# Patient Record
Sex: Male | Born: 1965 | Race: White | Hispanic: No | Marital: Married | Smoking: Never smoker
Health system: Southern US, Community
[De-identification: ages and names within clinical notes are randomized; demographics above are authoritative.]

## PROBLEM LIST (undated history)

## (undated) DIAGNOSIS — E785 Hyperlipidemia, unspecified: Secondary | ICD-10-CM

## (undated) DIAGNOSIS — I219 Acute myocardial infarction, unspecified: Secondary | ICD-10-CM

## (undated) DIAGNOSIS — I10 Essential (primary) hypertension: Secondary | ICD-10-CM

## (undated) HISTORY — PX: CORONARY ANGIOPLASTY WITH STENT PLACEMENT: SHX49

## (undated) HISTORY — PX: SHOULDER ARTHROSCOPY: SHX128

## (undated) HISTORY — PX: KNEE ARTHROSCOPY: SHX127

---

## 2016-08-17 ENCOUNTER — Emergency Department (INDEPENDENT_AMBULATORY_CARE_PROVIDER_SITE_OTHER)
Admission: EM | Admit: 2016-08-17 | Discharge: 2016-08-17 | Disposition: A | Discharge status: Home / Self Care | Payer: Federal, State, Local not specified - PPO | Source: Home / Self Care | Attending: Family Medicine | Admitting: Family Medicine

## 2016-08-17 ENCOUNTER — Encounter: Payer: Self-pay | Admitting: *Deleted

## 2016-08-17 DIAGNOSIS — B353 Tinea pedis: Secondary | ICD-10-CM | POA: Diagnosis not present

## 2016-08-17 HISTORY — DX: Hyperlipidemia, unspecified: E78.5

## 2016-08-17 HISTORY — DX: Essential (primary) hypertension: I10

## 2016-08-17 MED ORDER — CICLOPIROX OLAMINE 0.77 % EX CREA: TOPICAL_CREAM | Freq: Two times a day (BID) | CUTANEOUS | 1 refills | Status: DC

## 2016-08-17 NOTE — ED Triage Notes (Signed)
Pt c/o bilateral foot itching and burning x 1 week. Used tinactin and Lamisil otc.

## 2016-08-17 NOTE — ED Provider Notes (Signed)
[facility]    CSN: [id number] Arrival date & time: 08/17/16  1055     History   Chief Complaint Chief Complaint  Patient presents with  . Tinea Pedis    HPI Travis Walton is a 50 y.o. male.   Patient complains of two week history of pruritic/burning rash on both feet.   He has had no improvement with Tinactin and Lamisil cream.   The history is provided by the patient.  Rash  Location:  Foot Foot rash location:  Sole of L foot and sole of R foot Quality: burning, dryness, itchiness, redness and scaling   Quality: not blistering, not bruising, not draining, not painful, not peeling, not swelling and not weeping   Severity:  Mild Onset quality:  Gradual Duration:  2 weeks Timing:  Constant Progression:  Worsening Chronicity:  New Context: not animal contact, not chemical exposure, not exposure to similar rash, not hot tub use, not insect bite/sting, not medications, not new detergent/soap and not plant contact   Relieved by:  Nothing Worsened by:  Nothing Ineffective treatments:  Anti-fungal cream Associated symptoms: no induration, no joint pain and no myalgias     Past Medical History:  Diagnosis Date  . Hyperlipidemia   . Hypertension     There are no active problems to display for this patient.   Past Surgical History:  Procedure Laterality Date  . KNEE ARTHROSCOPY Left   . SHOULDER ARTHROSCOPY Left        Home Medications    Prior to Admission medications   Medication Sig Start Date End Date Taking? Authorizing Provider  atorvastatin (LIPITOR) 40 MG tablet Take 40 mg by mouth daily.   Yes Historical Provider, MD  Levothyroxine Sodium (SYNTHROID PO) Take by mouth.   Yes Historical Provider, MD  metoprolol (LOPRESSOR) 50 MG tablet Take 50 mg by mouth 2 (two) times daily.   Yes Historical Provider, MD  TESTOSTERONE IM Inject into the muscle.   Yes Historical Provider, MD  topiramate (TOPAMAX) 25 MG capsule Take 25 mg by mouth 2 (two)  times daily.   Yes Historical Provider, MD  zolmitriptan (ZOMIG) 5 MG tablet Take 5 mg by mouth as needed for migraine.   Yes Historical Provider, MD  ciclopirox (LOPROX) 0.77 % cream Apply topically 2 (two) times daily. Use for 2 to 4 weeks until condition clears. 08/17/16   Lattie HawStephen A Ante Arredondo, MD    Family History Family History  Problem Relation Age of Onset  . Heart failure Mother   . Heart failure Father     Social History Social History  Substance Use Topics  . Smoking status: Never Smoker  . Smokeless tobacco: Never Used  . Alcohol use No     Allergies   Hydrocodone   Review of Systems Review of Systems  Musculoskeletal: Negative for arthralgias and myalgias.  Skin: Positive for rash.  All other systems reviewed and are negative.    Physical Exam Triage Vital Signs ED Triage Vitals  Enc Vitals Group     BP 08/17/16 1119 131/83     Pulse Rate 08/17/16 1119 63     Resp 08/17/16 1119 14     Temp --      Temp src --      SpO2 08/17/16 1119 95 %     Weight 08/17/16 1120 294 lb (133.4 kg)     Height --      Head Circumference --      Peak Flow --  Pain Score 08/17/16 1124 5     Pain Loc --      Pain Edu? --      Excl. in GC? --    No data found.   Updated Vital Signs BP 131/83 (BP Location: Left Arm)   Pulse 63   Resp 14   Wt 294 lb (133.4 kg)   SpO2 95%   Visual Acuity Right Eye Distance:   Left Eye Distance:   Bilateral Distance:    Right Eye Near:   Left Eye Near:    Bilateral Near:     Physical Exam  Constitutional: He appears well-developed and well-nourished. No distress.  HENT:  Head: Normocephalic.  Eyes: Pupils are equal, round, and reactive to light.  Cardiovascular: Normal rate.   Pulmonary/Chest: Effort normal.  Musculoskeletal: He exhibits no edema.       Feet:  The web spaces between most toes are erythematous and slightly macerated.  The plantar surfaces over MTP joints are slightly erythematous and scaly.  No swelling  or tenderness to palpation.  Neurological: He is alert.  Skin: Skin is warm and dry.  Nursing note and vitals reviewed.    UC Treatments / Results  Labs (all labs ordered are listed, but only abnormal results are displayed) Labs Reviewed - No data to display  EKG  EKG Interpretation None       Radiology No results found.  Procedures Procedures (including critical care time)  Medications Ordered in UC Medications - No data to display   Initial Impression / Assessment and Plan / UC Course  I have reviewed the triage vital signs and the nursing notes.  Pertinent labs & imaging results that were available during my care of the patient were reviewed by me and considered in my medical decision making (see chart for details).  Clinical Course   Begin Ciclopirox 0.77% cream BID 2 to 4 weeks. May apply 1% hydrocortisone cream once or twice daily to control itching.  If not improved with topical preparation, could switch to oral Lamisil.     Final Clinical Impressions(s) / UC Diagnoses   Final diagnoses:  Tinea pedis of both feet    New Prescriptions New Prescriptions   CICLOPIROX (LOPROX) 0.77 % CREAM    Apply topically 2 (two) times daily. Use for 2 to 4 weeks until condition clears.     Lattie HawStephen A Kitiara Hintze, MD 08/24/16 [phone]

## 2016-08-17 NOTE — Discharge Instructions (Signed)
May apply 1% hydrocortisone cream once or twice daily to control itching.

## 2017-02-16 ENCOUNTER — Encounter: Payer: Self-pay | Admitting: *Deleted

## 2017-02-16 ENCOUNTER — Emergency Department (INDEPENDENT_AMBULATORY_CARE_PROVIDER_SITE_OTHER): Payer: Federal, State, Local not specified - PPO

## 2017-02-16 ENCOUNTER — Emergency Department
Admission: EM | Admit: 2017-02-16 | Discharge: 2017-02-16 | Disposition: A | Discharge status: Home / Self Care | Payer: Federal, State, Local not specified - PPO | Source: Home / Self Care | Attending: Family Medicine | Admitting: Family Medicine

## 2017-02-16 DIAGNOSIS — M7989 Other specified soft tissue disorders: Secondary | ICD-10-CM

## 2017-02-16 DIAGNOSIS — M189 Osteoarthritis of first carpometacarpal joint, unspecified: Secondary | ICD-10-CM | POA: Diagnosis not present

## 2017-02-16 DIAGNOSIS — M79641 Pain in right hand: Secondary | ICD-10-CM

## 2017-02-16 DIAGNOSIS — Z89021 Acquired absence of right finger(s): Secondary | ICD-10-CM | POA: Diagnosis not present

## 2017-02-16 DIAGNOSIS — S60221A Contusion of right hand, initial encounter: Secondary | ICD-10-CM

## 2017-02-16 HISTORY — DX: Acute myocardial infarction, unspecified: I21.9

## 2017-02-16 NOTE — ED Triage Notes (Signed)
Patient reports hitting the back of his right hand yesterday while swinging his arms walking. Pain and swelling developed. No previous injury.

## 2017-02-16 NOTE — ED Provider Notes (Signed)
CSN: [id number]     Arrival date & time 02/16/17  1150 History   First MD Initiated Contact with Patient 02/16/17 1211     Chief Complaint  Patient presents with  . Hand Injury   (Consider location/radiation/quality/duration/timing/severity/associated sxs/prior Treatment) HPI Travis Walton is a 51 y.o. male presenting to UC with c/o Right hand pain and swelling that started yesterday after hitting his hand on an object.  Pt notes he was swinging his arm while walking and hit something but does not recall what. He does not think he broke his hand but is concerned the swelling has not improved since yesterday. 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  He is Right hand dominant.    Past Medical History:  Diagnosis Date  . Heart attack (HCC)   . Hyperlipidemia   . Hypertension    Past Surgical History:  Procedure Laterality Date  . CORONARY ANGIOPLASTY WITH STENT PLACEMENT    . KNEE ARTHROSCOPY Left   . SHOULDER ARTHROSCOPY Left    Family History  Problem Relation Age of Onset  . Heart failure Mother   . Heart failure Father    Social History  Substance Use Topics  . Smoking status: Never Smoker  . Smokeless tobacco: Never Used  . Alcohol use No    Review of Systems  Musculoskeletal: Positive for arthralgias, joint swelling and myalgias.       Right hand  Skin: Negative for color change and wound.  Neurological: Negative for weakness and numbness.    Allergies  Hydrocodone  Home Medications   Prior to Admission medications   Medication Sig Start Date End Date Taking? Authorizing Provider  aspirin EC 81 MG tablet Take 81 mg by mouth daily.   Yes [provider]  atorvastatin (LIPITOR) 80 MG tablet Take 80 mg by mouth daily.   Yes [provider]  busPIRone (BUSPAR) 10 MG tablet Take 10 mg by mouth 3 (three) times daily.   Yes [provider]  Levothyroxine Sodium (SYNTHROID PO) Take  by mouth.   Yes [provider]  metoprolol (LOPRESSOR) 50 MG tablet Take 50 mg by mouth 2 (two) times daily.   Yes [provider]  mirtazapine (REMERON) 15 MG tablet Take 15 mg by mouth at bedtime.   Yes [provider]  nitroGLYCERIN (NITROSTAT) 0.4 MG SL tablet Place 0.4 mg under the tongue every 5 (five) minutes as needed for chest pain.   Yes [provider]  rosuvastatin (CRESTOR) 40 MG tablet Take 40 mg by mouth daily.   Yes [provider]  sertraline (ZOLOFT) 100 MG tablet Take 100 mg by mouth daily.   Yes [provider]  TESTOSTERONE IM Inject into the muscle.   Yes [provider]  ticagrelor (BRILINTA) 90 MG TABS tablet Take by mouth 2 (two) times daily.   Yes [provider]  topiramate (TOPAMAX) 25 MG capsule Take 25 mg by mouth 2 (two) times daily.   Yes [provider]  zolmitriptan (ZOMIG) 5 MG tablet Take 5 mg by mouth as needed for migraine.   Yes [provider]   Meds Ordered and Administered this Visit  Medications - No data to display  BP 131/81 (BP Location: Left Arm)   Pulse 63   Wt 282 lb (127.9 kg)   SpO2 96%  No data found.   Physical Exam  Constitutional: He is oriented to person, place, and  time. He appears well-developed and well-nourished.  HENT:  Head: Normocephalic and atraumatic.  Eyes: EOM are normal.  Neck: Normal range of motion.  Cardiovascular: Normal rate.   Pulses:      Radial pulses are 2+ on the right side.  Pulmonary/Chest: Effort normal.  Musculoskeletal: Normal range of motion. He exhibits edema and tenderness.  Right hand: mild to moderate edema to dorsal aspect over 2nd and 3rd metacarpals. Tender. Full ROM  Neurological: He is alert and oriented to person, place, and time.  Skin: Skin is warm and dry.  Right hand: skin in tact. Faint ecchymosis.   Psychiatric: He has a normal mood and affect. His behavior is normal.  Nursing note and vitals  reviewed.   Urgent Care Course     Procedures (including critical care time)  Labs Review Labs Reviewed - No data to display  Imaging Review Dg Hand Complete Right  Result Date: 02/16/2017 CLINICAL DATA:  Accidental blow to the back of the hand while walking yesterday with onset of pain. Initial encounter. EXAM: RIGHT HAND - COMPLETE 3+ VIEW COMPARISON:  None. FINDINGS: No acute bony or joint abnormality is identified. 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 There is also scattered mild appearing osteoarthritis about the DIP joints. Soft tissues are unremarkable. IMPRESSION: No acute abnormality. 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 Electronically Signed   By: Drusilla Kanner M.D.   On: 02/16/2017 12:42    MDM   1. Contusion of right hand, initial encounter   2. Right hand pain   3. Swelling of right hand    Hand swelling with pain and mild ecchymosis. No erythema or warmth. Doubt infection No evidence of bony injury on imaging.  Ace wrap applied for comfort and help reduce swelling Encouraged use of ice and taking acetaminophen and ibuprofen for pain F/u with PCP or Sports Medicine next week if not improving.    Junius Finner, PA-C 02/16/17 [phone]

## 2017-03-20 ENCOUNTER — Encounter (HOSPITAL_COMMUNITY): Payer: Self-pay

## 2017-03-20 ENCOUNTER — Emergency Department (HOSPITAL_COMMUNITY): Payer: Federal, State, Local not specified - PPO

## 2017-03-20 DIAGNOSIS — Z885 Allergy status to narcotic agent status: Secondary | ICD-10-CM | POA: Insufficient documentation

## 2017-03-20 DIAGNOSIS — I252 Old myocardial infarction: Secondary | ICD-10-CM | POA: Diagnosis not present

## 2017-03-20 DIAGNOSIS — I10 Essential (primary) hypertension: Secondary | ICD-10-CM | POA: Diagnosis not present

## 2017-03-20 DIAGNOSIS — S46212A Strain of muscle, fascia and tendon of other parts of biceps, left arm, initial encounter: Secondary | ICD-10-CM | POA: Insufficient documentation

## 2017-03-20 DIAGNOSIS — Y9389 Activity, other specified: Secondary | ICD-10-CM | POA: Diagnosis not present

## 2017-03-20 DIAGNOSIS — Y929 Unspecified place or not applicable: Secondary | ICD-10-CM | POA: Insufficient documentation

## 2017-03-20 DIAGNOSIS — Y990 Civilian activity done for income or pay: Secondary | ICD-10-CM | POA: Insufficient documentation

## 2017-03-20 DIAGNOSIS — X500XXA Overexertion from strenuous movement or load, initial encounter: Secondary | ICD-10-CM | POA: Insufficient documentation

## 2017-03-20 DIAGNOSIS — Z7982 Long term (current) use of aspirin: Secondary | ICD-10-CM | POA: Insufficient documentation

## 2017-03-20 DIAGNOSIS — Z79899 Other long term (current) drug therapy: Secondary | ICD-10-CM | POA: Diagnosis not present

## 2017-03-20 DIAGNOSIS — M79602 Pain in left arm: Secondary | ICD-10-CM | POA: Diagnosis present

## 2017-03-20 NOTE — ED Triage Notes (Signed)
Pt reports he was lifting a heavy object today while at work and heard something pop in his left anterior upper arm. He reports the pain started at 7pm. Pain worse with movement. Denies CP/SOB. Hx of MI in Dec but states this "is unrelated. This is a muscle pain. Its tender to touch"

## 2017-03-21 ENCOUNTER — Emergency Department (HOSPITAL_COMMUNITY)
Admission: EM | Admit: 2017-03-21 | Discharge: 2017-03-21 | Disposition: A | Discharge status: Home / Self Care | Payer: Federal, State, Local not specified - PPO | Attending: Emergency Medicine | Admitting: Emergency Medicine

## 2017-03-21 DIAGNOSIS — S46212A Strain of muscle, fascia and tendon of other parts of biceps, left arm, initial encounter: Secondary | ICD-10-CM

## 2017-03-21 MED ORDER — TRAMADOL HCL 50 MG PO TABS: 50.0000 mg | ORAL_TABLET | Freq: Four times a day (QID) | ORAL | 0 refills | Status: AC | PRN

## 2017-03-21 NOTE — ED Provider Notes (Signed)
[facility] Provider Note   CSN: [id number] Arrival date & time: 03/20/17  2132  By signing my name below, I, Deland PrettySherilynn Knight, attest that this documentation has been prepared under the direction and in the presence of Taressa Rauh, Canary Brimhristopher J, *. Electronically Signed: Deland PrettySherilynn Knight, ED Scribe. 03/21/17. 3:20 AM.  History   Chief Complaint Chief Complaint  Patient presents with  . Arm Pain    left   The history is provided by the patient and the spouse. No language interpreter was used.   HPI Comments: Travis Walton is a 51 y.o. male who presents to the Emergency Department complaining of sudden onset of moderate upper left arm s/p an injury that occurred yesterday at 7:00pm. The pt reports that he was lifting a heavy object while at work, and heard something "pop." Movement exacerbates his pain. He has a PMHx of MI, HLD, and HTN. His PSHx includes CAD, TKR, and shoulder arthroscopy. He reports 3 additional shoulder surgeries, with the last. The pt denies fever.  Past Medical History:  Diagnosis Date  . Heart attack (HCC)   . Hyperlipidemia   . Hypertension     There are no active problems to display for this patient.   Past Surgical History:  Procedure Laterality Date  . CORONARY ANGIOPLASTY WITH STENT PLACEMENT    . KNEE ARTHROSCOPY Left   . SHOULDER ARTHROSCOPY Left        Home Medications    Prior to Admission medications   Medication Sig Start Date End Date Taking? Authorizing Provider  aspirin EC 81 MG tablet Take 81 mg by mouth daily.    [provider]  atorvastatin (LIPITOR) 80 MG tablet Take 80 mg by mouth daily.    [provider]  busPIRone (BUSPAR) 10 MG tablet Take 10 mg by mouth 3 (three) times daily.    [provider]  Levothyroxine Sodium (SYNTHROID PO) Take by mouth.    [provider]  metoprolol (LOPRESSOR) 50 MG tablet Take 50 mg by mouth 2 (two) times daily.    [provider]    mirtazapine (REMERON) 15 MG tablet Take 15 mg by mouth at bedtime.    [provider]  nitroGLYCERIN (NITROSTAT) 0.4 MG SL tablet Place 0.4 mg under the tongue every 5 (five) minutes as needed for chest pain.    [provider]  rosuvastatin (CRESTOR) 40 MG tablet Take 40 mg by mouth daily.    [provider]  sertraline (ZOLOFT) 100 MG tablet Take 100 mg by mouth daily.    [provider]  TESTOSTERONE IM Inject into the muscle.    [provider]  ticagrelor (BRILINTA) 90 MG TABS tablet Take by mouth 2 (two) times daily.    [provider]  topiramate (TOPAMAX) 25 MG capsule Take 25 mg by mouth 2 (two) times daily.    [provider]  traMADol (ULTRAM) 50 MG tablet Take 1 tablet (50 mg total) by mouth every 6 (six) hours as needed. 03/21/17   Gilda CreasePollina, Manuelita Moxon J, MD  zolmitriptan (ZOMIG) 5 MG tablet Take 5 mg by mouth as needed for migraine.    [provider]    Family History Family History  Problem Relation Age of Onset  . Heart failure Mother   . Heart failure Father     Social History Social History  Substance Use Topics  . Smoking status: Never Smoker  . Smokeless tobacco: Never Used  . Alcohol use No  Allergies   Hydrocodone   Review of Systems Review of Systems  Constitutional: Negative for fever.  Musculoskeletal: Positive for myalgias.     Physical Exam Updated Vital Signs BP (!) 151/84   Pulse (!) 55   Temp 98.6 F (37 C) (Oral)   Resp 18   SpO2 99%   Physical Exam  Constitutional: He is oriented to person, place, and time. He appears well-developed and well-nourished.  HENT:  Head: Normocephalic.  Eyes: EOM are normal.  Neck: Normal range of motion.  Pulmonary/Chest: Effort normal.  Abdominal: He exhibits no distension.  Musculoskeletal: Normal range of motion.       Left elbow: He exhibits deformity (There is a concavity of the distal aspect of medial head of the  bicep.).  Neurological: He is alert and oriented to person, place, and time.  Psychiatric: He has a normal mood and affect.  Nursing note and vitals reviewed.    ED Treatments / Results   DIAGNOSTIC STUDIES: Oxygen Saturation is 96% on RA, adequate by my interpretation.   COORDINATION OF CARE: 3:16 AM-Discussed next steps with pt including ibuprofen and tylenol. Pt verbalized understanding and is agreeable with the plan.   Labs (all labs ordered are listed, but only abnormal results are displayed) Labs Reviewed - No data to display  EKG  EKG Interpretation  Date/Time:  Tuesday March 20 2017 22:21:46 EDT Ventricular Rate:  62 PR Interval:  180 QRS Duration: 100 QT Interval:  388 QTC Calculation: 393 R Axis:   50 Text Interpretation:  Normal sinus rhythm Normal ECG Confirmed by Gilda Crease [phone]) on 03/21/2017 3:05:09 AM       Radiology Dg Humerus Left  Result Date: 03/21/2017 CLINICAL DATA:  Acute onset of left elbow pain after injury while lifting heavy object. Initial encounter. EXAM: LEFT HUMERUS - 2+ VIEW COMPARISON:  None. FINDINGS: There is no evidence of fracture or dislocation. The left humeral head remains seated at the glenoid fossa. The elbow joint is grossly unremarkable. No elbow joint effusion is identified. There is widening of the left acromioclavicular joint to 1.6 cm, raising question for mild left acromioclavicular joint injury. IMPRESSION: 1. No evidence of fracture or dislocation. 2. Widening of the left acromioclavicular joint to 1.6 cm, raising question for mild left acromioclavicular joint injury. Electronically Signed   By: Roanna Raider M.D.   On: 03/21/2017 00:02    Procedures Procedures (including critical care time)  Medications Ordered in ED Medications - No data to display   Initial Impression / Assessment and Plan / ED Course  I have reviewed the triage vital signs and the nursing notes.  Pertinent labs & imaging results that  were available during my care of the patient were reviewed by me and considered in my medical decision making (see chart for details).     Patient presents to the emergency department for evaluation of acute onset left bicep area arm pain after lifting an object at work. Patient reports that he felt a pop and has had persistent pain on the inside aspect of his bicep ever since. Patient had previous shoulder surgery including proximal bicep repair, was told by his orthopedic surgeon that his lower bicep was at risk for rupture. Examination does reveal a concavity of the medial head of the bicep at the distal portion that is concerning for biceps tendon rupture. Will treat with sling, analgesia, follow up with orthopedics.  Final Clinical Impressions(s) / ED Diagnoses   Final diagnoses:  Biceps  tendon rupture, left, initial encounter    New Prescriptions New Prescriptions   TRAMADOL (ULTRAM) 50 MG TABLET    Take 1 tablet (50 mg total) by mouth every 6 (six) hours as needed.   I personally performed the services described in this documentation, which was scribed in my presence. The recorded information has been reviewed and is accurate.     Gilda Crease, MD 03/21/17 [phone]

## 2017-04-17 ENCOUNTER — Other Ambulatory Visit: Payer: Self-pay | Admitting: Orthopedic Surgery

## 2017-04-17 DIAGNOSIS — S46212A Strain of muscle, fascia and tendon of other parts of biceps, left arm, initial encounter: Secondary | ICD-10-CM

## 2017-04-26 ENCOUNTER — Ambulatory Visit
Admission: RE | Admit: 2017-04-26 | Discharge: 2017-04-26 | Disposition: A | Discharge status: Home / Self Care | Payer: Federal, State, Local not specified - PPO | Source: Ambulatory Visit | Attending: Orthopedic Surgery | Admitting: Orthopedic Surgery

## 2017-04-26 DIAGNOSIS — S46212A Strain of muscle, fascia and tendon of other parts of biceps, left arm, initial encounter: Secondary | ICD-10-CM

## 2018-06-21 IMAGING — DX DG HAND COMPLETE 3+V*R*
3 series · 3 of 3 positions shown · non-contrast
Comparison: None.

CLINICAL DATA: Accidental blow to the back of the hand while
walking yesterday with onset of pain. Initial encounter.

EXAM:
RIGHT HAND - COMPLETE 3+ VIEW

[hand pa]
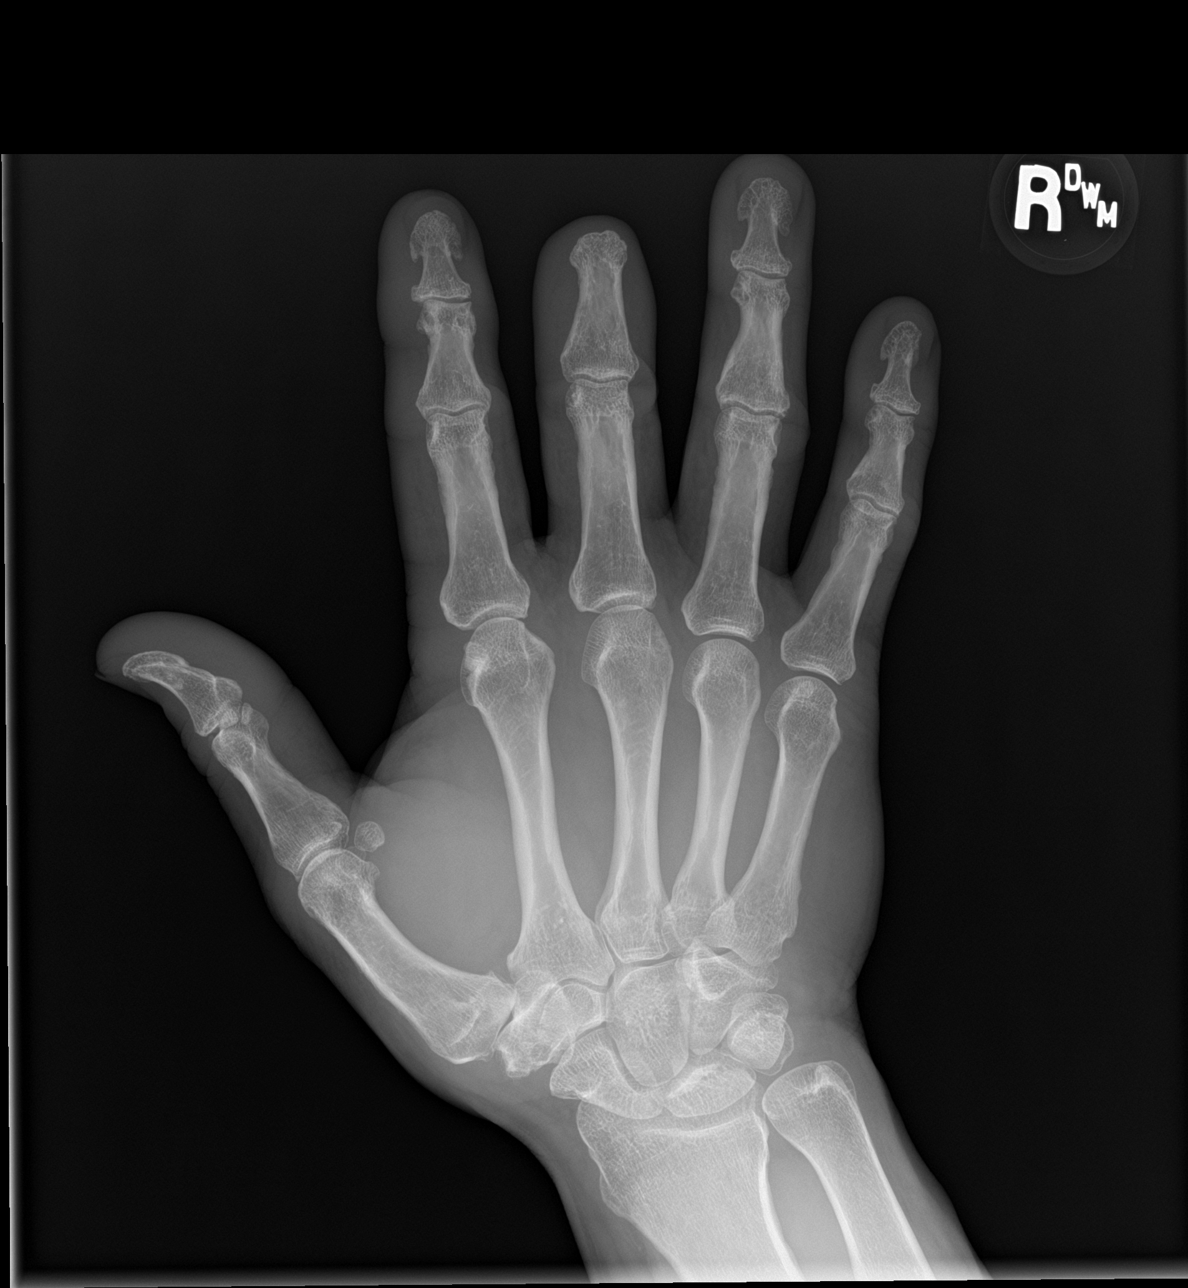

[hand obl]
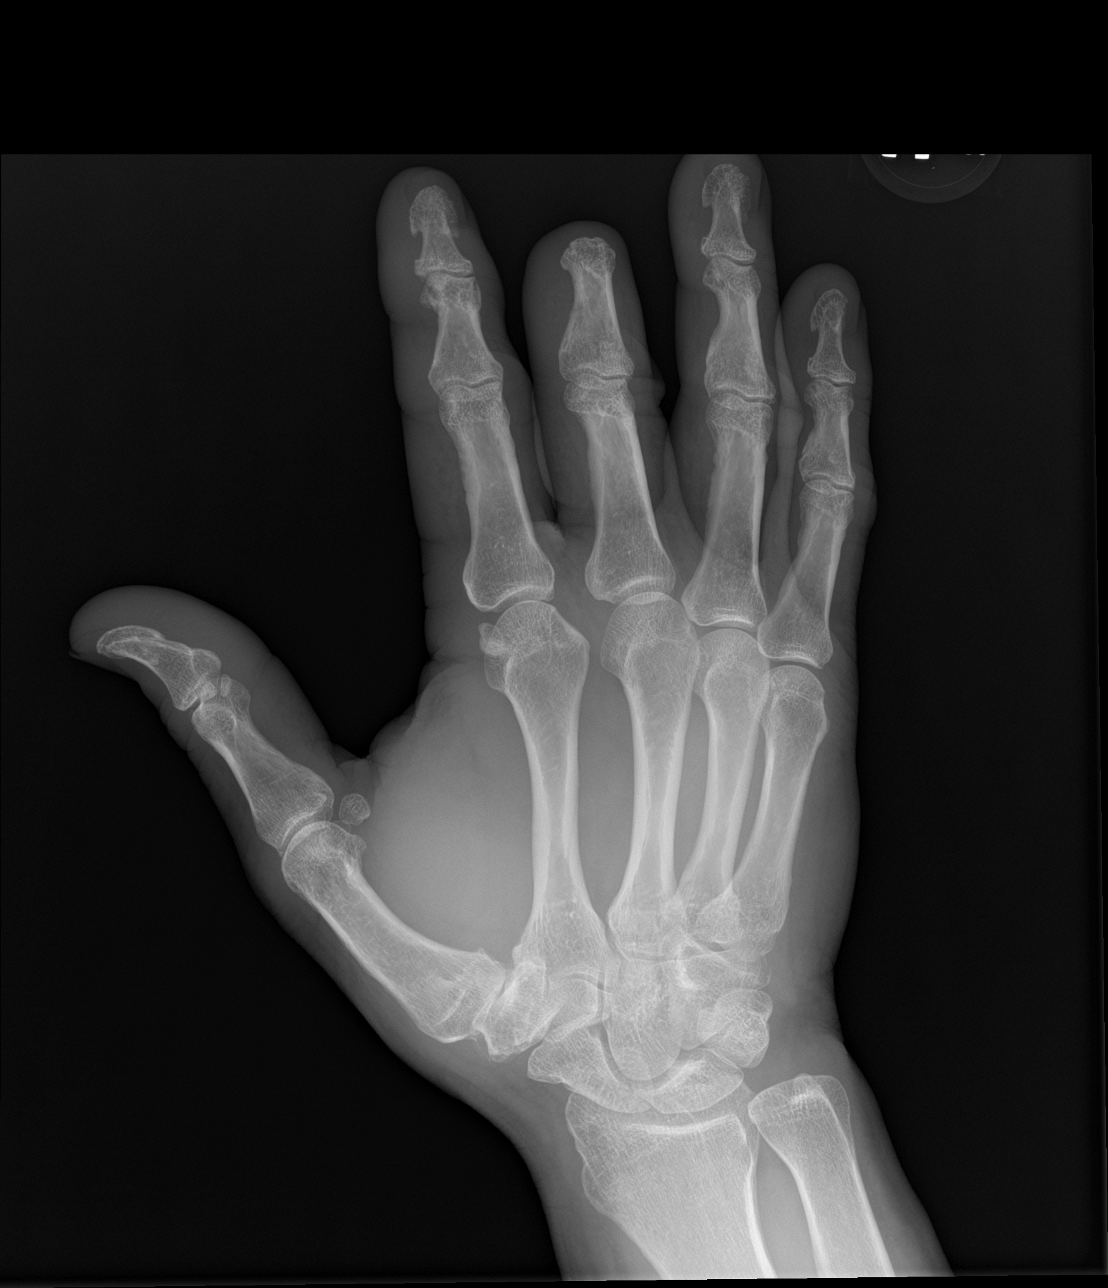

[hand lat]
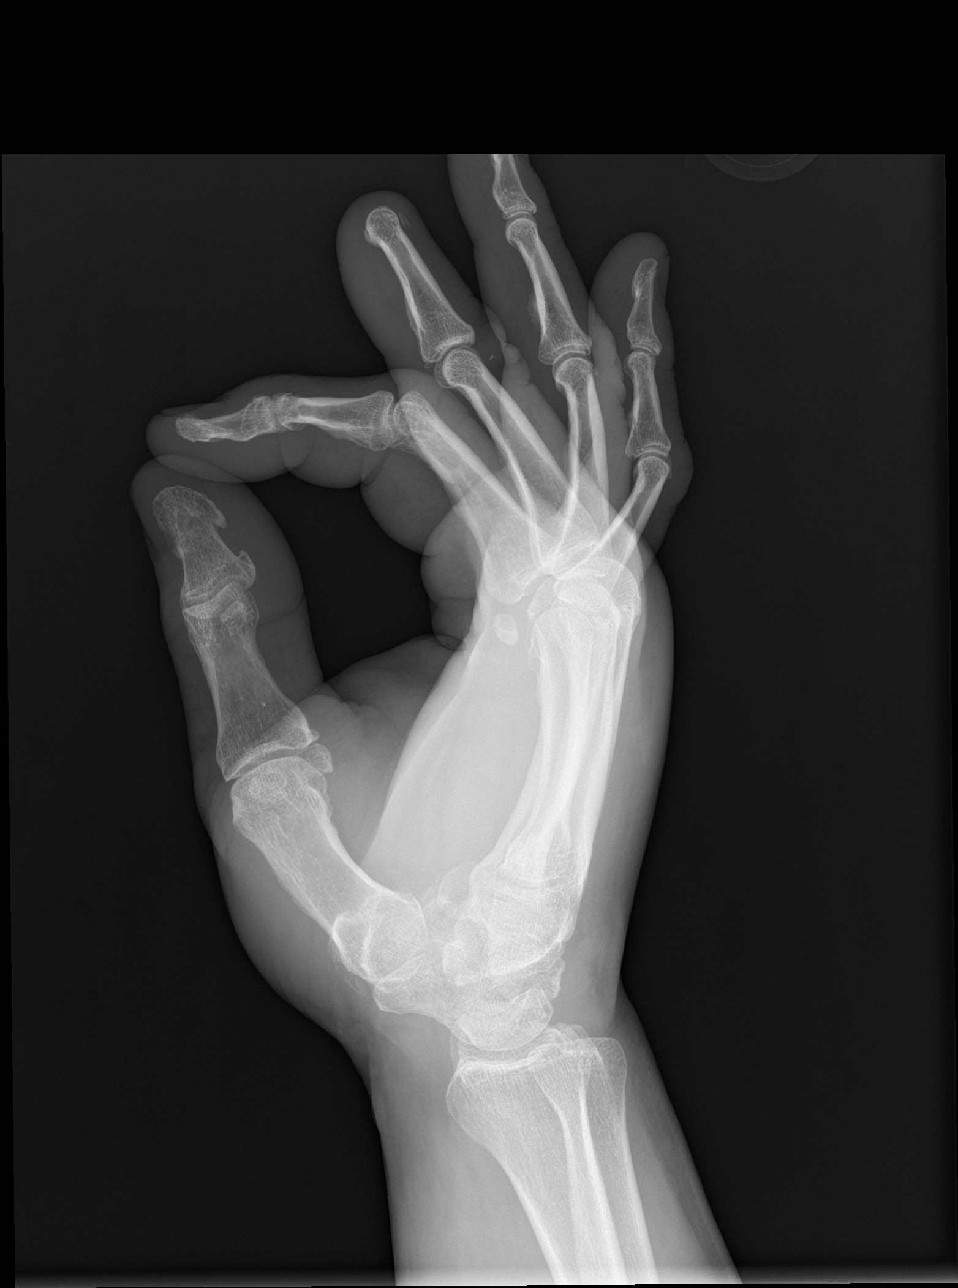

[3 of 3 positions shown; findings below may reference images not displayed]

FINDINGS: No acute bony or joint abnormality is identified. The patient is
status post amputation of the distal phalanx long finger. Moderate
first CMC osteoarthritis is seen. There is also scattered mild
appearing osteoarthritis about the DIP joints. Soft tissues are
unremarkable.
IMPRESSION: No acute abnormality.

First CMC osteoarthritis.

Status post amputation of the distal phalanx long finger.

## 2018-08-29 IMAGING — MR MR ELBOW*L* W/O CM
4 series · 20 of 40 positions shown · non-contrast
Comparison: None.

CLINICAL DATA: Left elbow injury lifting a 50 lb mail tray on
03/20/2017 with continued left elbow pain. Question biceps tendon
tear. Initial encounter.

EXAM:
MRI OF THE LEFT ELBOW WITHOUT CONTRAST
TECHNIQUE: Multiplanar, multisequence MR imaging of the elbow was performed. No
intravenous contrast was administered.

[Series 3: T1 · axial · 3.0mm · 0.31mm/px · z∈[-46,+29]mm · 3 of 27 slices shown]
[im 5/27]
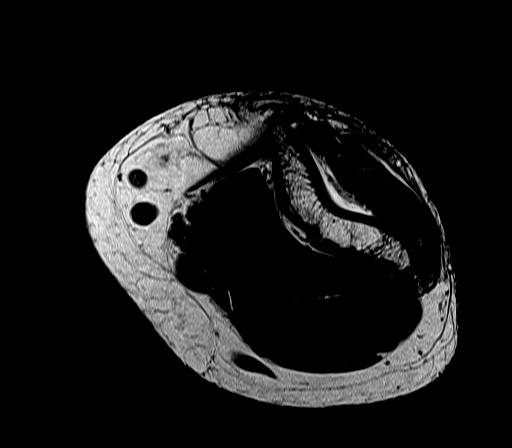
[im 15/27]
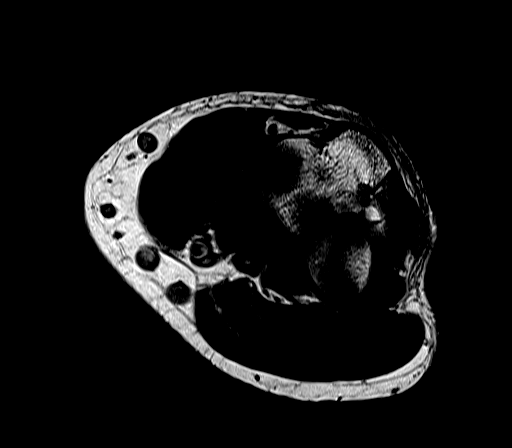
[im 24/27]
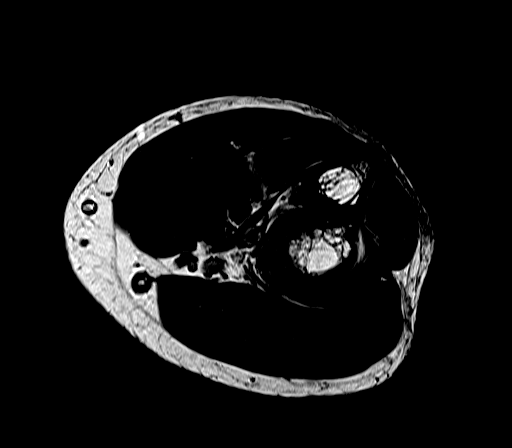

[Series 4: T2 fat-sat · axial · 3.0mm · 0.33mm/px · z∈[-63,+29]mm · 5 of 27 slices shown (1 of 2)]
[im 1/27]
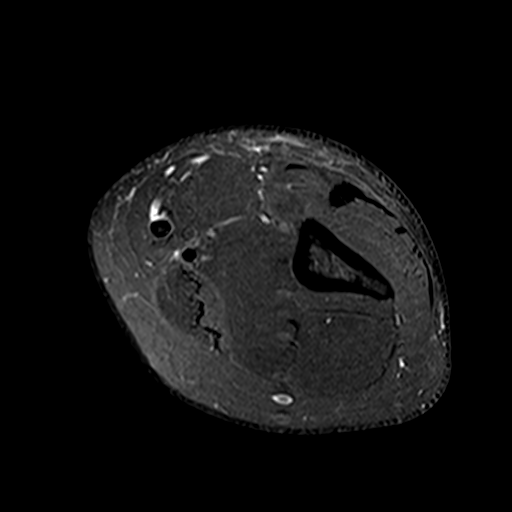
[im 5/27]
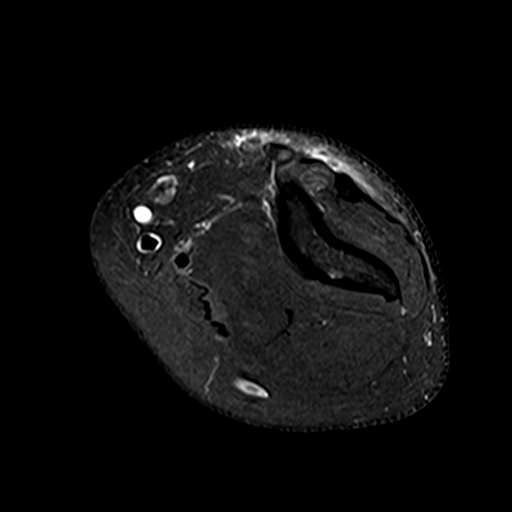
[im 8/27]
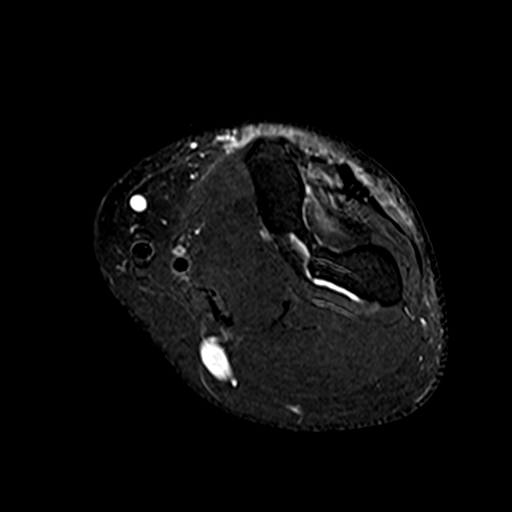
[im 15/27]
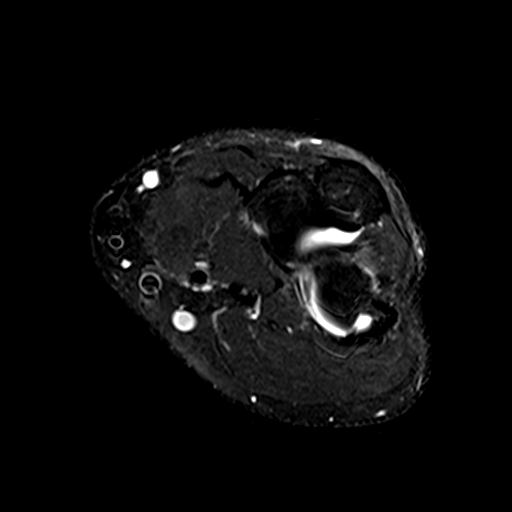
[im 24/27]
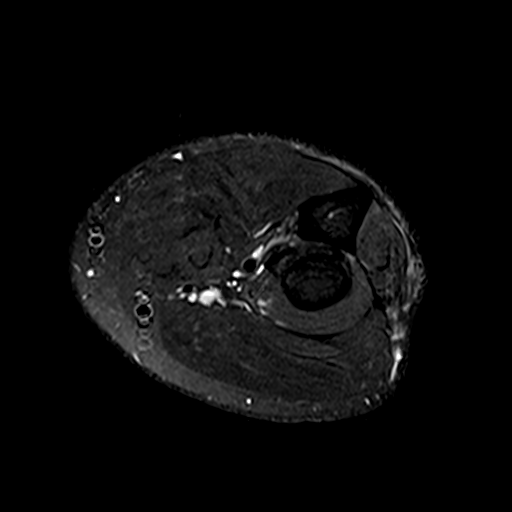

[Series 5: T2 fat-sat · coronal · 4.0mm · 0.31mm/px · 3 of 15 slices shown (2 of 2)]
[im 3/15]
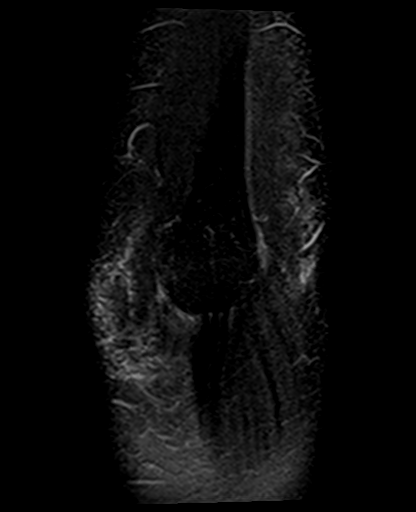
[im 8/15]
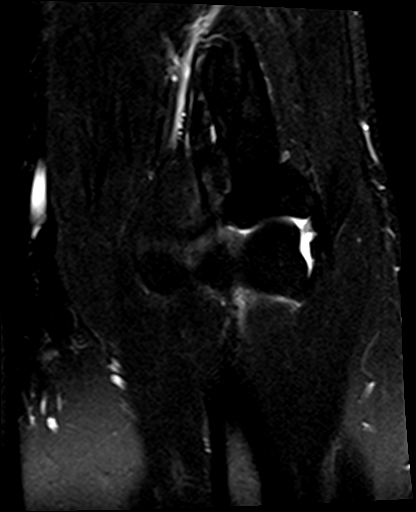
[im 12/15]
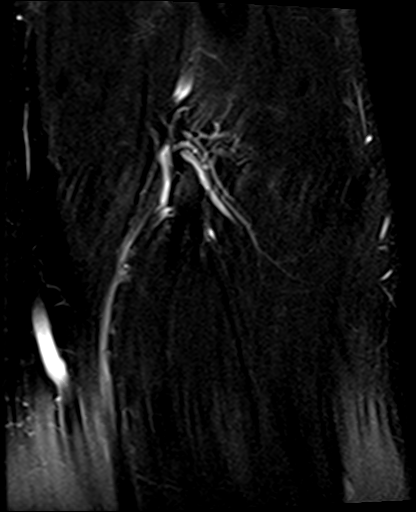

[Series 6: PD fat-sat · sagittal · 4.0mm · 0.25mm/px · 9 of 21 slices shown]
[im 1/21]
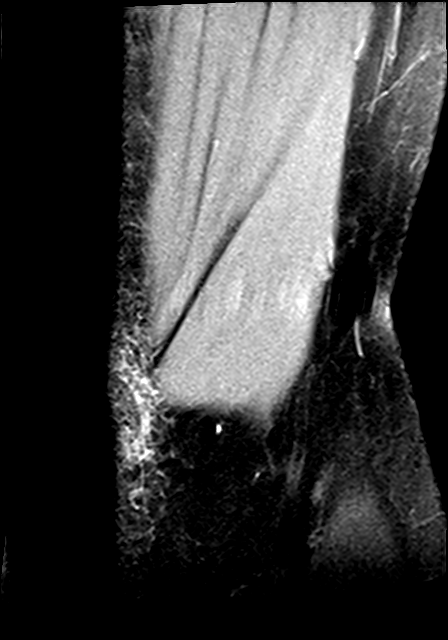
[im 3/21]
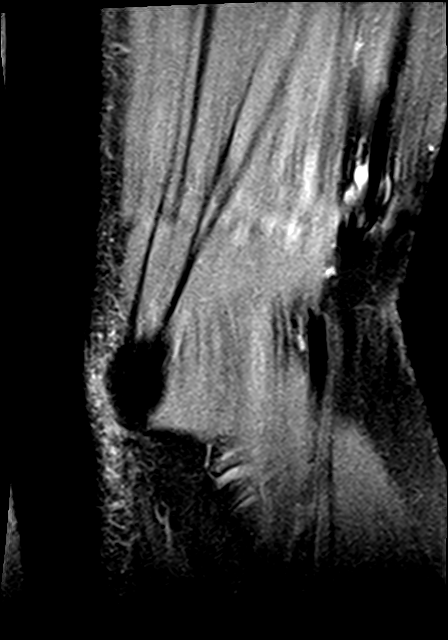
[im 6/21]
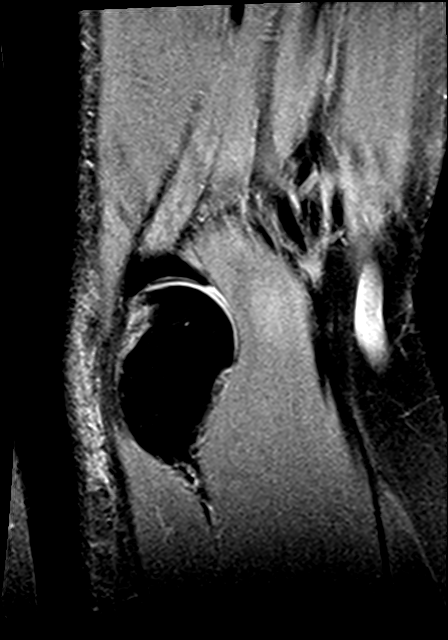
[im 8/21]
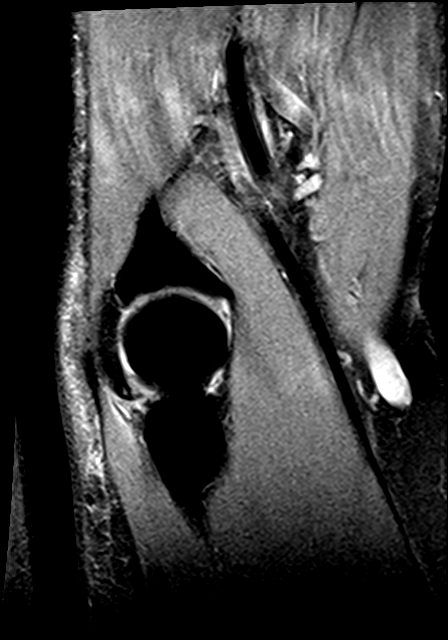
[im 11/21]
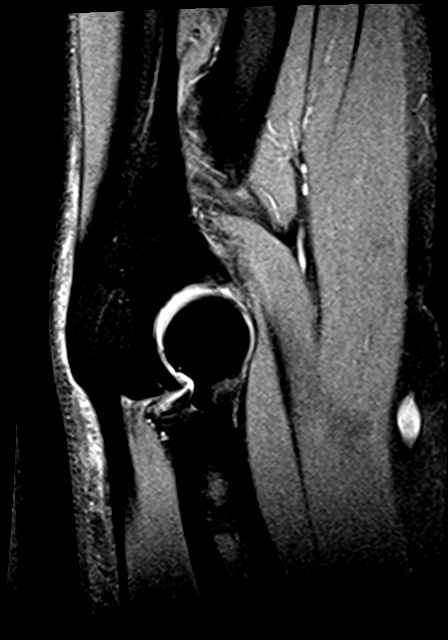
[im 13/21]
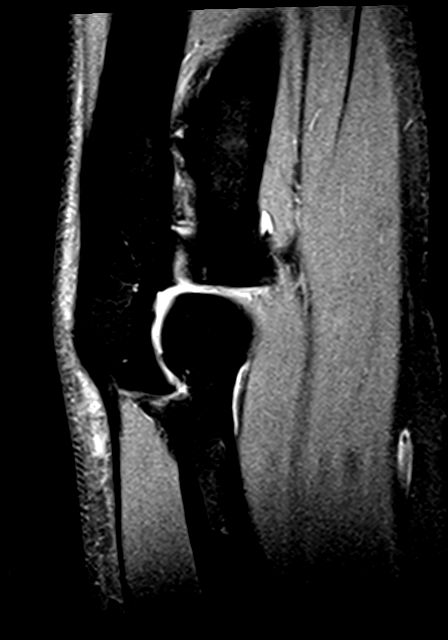
[im 16/21]
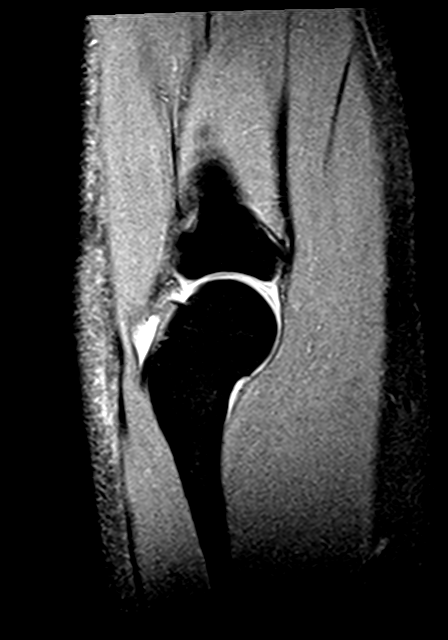
[im 18/21]
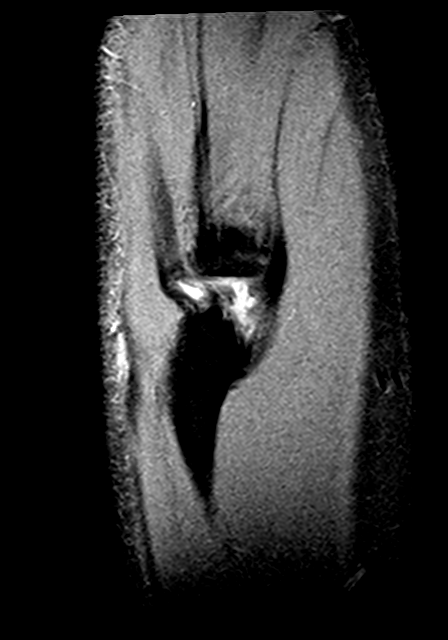
[im 21/21]
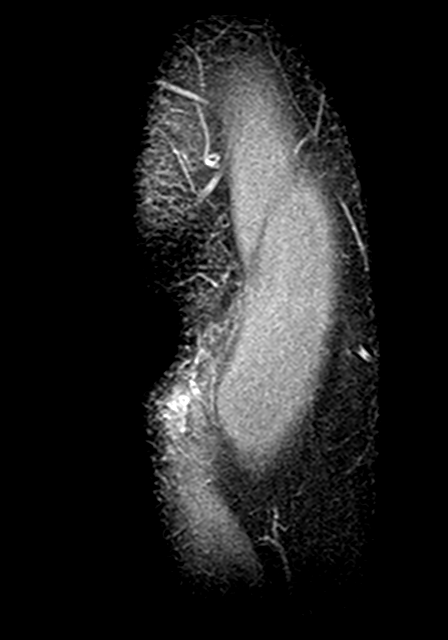

[20 of 40 positions shown; findings below may reference images not displayed]

FINDINGS: TENDONS

Common forearm flexor origin: Intact.

Common forearm extensor origin: Abnormal intrasubstance increased T2
signal is seen in the common extensor origin, worst in the extensor
carpi the radialis brevis consistent with lateral epicondylitis.

Biceps: Intact and normal in appearance.

Triceps: Intact and normal in appearance.

LIGAMENTS

Medial stabilizers: Intact and normal in appearance.

Lateral stabilizers: Intact. Intermediate increased T2 signal and
thickening are seen in the superior fibers of the lateral ulnar
collateral ligament consistent with sprain without tear.

Cartilage: Minimally degenerated.

Joint: No joint effusion.

Cubital tunnel: Normal.

Bones: Tiny subchondral cyst in the radial head is noted. Marrow
signal is otherwise unremarkable.
IMPRESSION: Findings consistent with lateral epicondylitis without tendon tear.
Associated mild to moderate sprain of the lateral ulnar collateral
ligament without tear is identified.

Intact biceps tendon.

Mild degenerative disease about the elbow.
